# Patient Record
Sex: Female | Born: 1975 | Race: Black or African American | Hispanic: No | Marital: Single | Smoking: Never smoker
Health system: Southern US, Community
[De-identification: ages and names within clinical notes are randomized; demographics above are authoritative.]

## PROBLEM LIST (undated history)

## (undated) DIAGNOSIS — J45909 Unspecified asthma, uncomplicated: Secondary | ICD-10-CM

## (undated) HISTORY — PX: ANTERIOR CRUCIATE LIGAMENT REPAIR: SHX115

---

## 2016-08-10 ENCOUNTER — Encounter (HOSPITAL_BASED_OUTPATIENT_CLINIC_OR_DEPARTMENT_OTHER): Payer: Self-pay

## 2016-08-10 ENCOUNTER — Emergency Department (HOSPITAL_BASED_OUTPATIENT_CLINIC_OR_DEPARTMENT_OTHER)
Admission: EM | Admit: 2016-08-10 | Discharge: 2016-08-11 | Disposition: A | Discharge status: Home / Self Care | Payer: Self-pay | Attending: Emergency Medicine | Admitting: Emergency Medicine

## 2016-08-10 DIAGNOSIS — J45909 Unspecified asthma, uncomplicated: Secondary | ICD-10-CM | POA: Insufficient documentation

## 2016-08-10 DIAGNOSIS — Z760 Encounter for issue of repeat prescription: Secondary | ICD-10-CM

## 2016-08-10 DIAGNOSIS — R55 Syncope and collapse: Secondary | ICD-10-CM | POA: Insufficient documentation

## 2016-08-10 DIAGNOSIS — Z79899 Other long term (current) drug therapy: Secondary | ICD-10-CM | POA: Insufficient documentation

## 2016-08-10 DIAGNOSIS — T50905A Adverse effect of unspecified drugs, medicaments and biological substances, initial encounter: Secondary | ICD-10-CM | POA: Insufficient documentation

## 2016-08-10 HISTORY — DX: Unspecified asthma, uncomplicated: J45.909

## 2016-08-10 LAB — CBG MONITORING, ED: GLUCOSE-CAPILLARY: 104 mg/dL — AB (ref 65–99)

## 2016-08-10 NOTE — ED Triage Notes (Signed)
Per EMS patient c/o hearing loss, dizziness, nausea, and diaphoresis tonight. Pt states she was sitting at a table when this occurred. Pt reports one beer tonight. Pt states she has not eaten since Thursday, has not slept since Sunday, and has had excessive caffeine use today (4-5 large coffees) due to being excited for an upcoming event.

## 2016-08-10 NOTE — ED Provider Notes (Signed)
[facility]   CSN: [id number] Arrival date & time: 08/10/16  2339  By signing my name below, I, Rosario AdieWilliam Andrew Hiatt, attest that this documentation has been prepared under the direction and in the presence of Meila Berke, MD. Electronically Signed: Rosario AdieWilliam Andrew Hiatt, ED Scribe. 08/11/16. 12:13 AM.  History   Chief Complaint Chief Complaint  Patient presents with  . Dizziness    The history is provided by the patient. No language interpreter was used.  Dizziness  Quality:  Lightheadedness Onset quality:  Sudden Timing:  Constant Progression:  Resolved Chronicity:  New Context: medication   Context: not with loss of consciousness   Context comment:  Large caffeine intake, alcohol and mucinex with new singulair rx and no sleep  Relieved by:  Nothing Worsened by:  Nothing Ineffective treatments:  None tried Associated symptoms: no blood in stool, no chest pain, no diarrhea, no headaches, no hearing loss, no nausea, no palpitations, no shortness of breath, no syncope, no tinnitus, no vision changes, no vomiting and no weakness   Risk factors: new medications    HPI Comments: Paula Duke is a 41 y.o. female with a PMHx of asthma and seasonal allergies who presents to the Emergency Department complaining of intermittent episodes of dizziness onset this evening. She describes her dizziness as a lightheaded sensation. Pt reports that she was at home tonight sitting down when she had an acute onset of dizziness without any known precipitating factors. She reports associated diaphoresis, and nausea secondary to the onset of her dizziness. This resolves shortly after onset; however, 15 minutes following resolution, her dizziness returned. Per pt, she recently started taking Singulair 5 days ago and Mucinex this afternoon. She notes she also takes prednisone as needed for her asthma symptoms. She denies hx of smoking. She denies vomiting and any other complaints  or symptoms at this time.  No leg pain nor swelling no CP nor SOB, perc negative wells 0.    Past Medical History:  Diagnosis Date  . Asthma     There are no active problems to display for this patient.   Past Surgical History:  Procedure Laterality Date  . ANTERIOR CRUCIATE LIGAMENT REPAIR      OB History    No data available       Home Medications    Prior to Admission medications   Medication Sig Start Date End Date Taking? Authorizing Provider  ALBUTEROL IN Inhale into the lungs.   Yes [provider]  Montelukast Sodium (SINGULAIR PO) Take by mouth.   Yes [provider]   Family History History reviewed. No pertinent family history.  Social History Social History  Substance Use Topics  . Smoking status: Never Smoker  . Smokeless tobacco: Never Used  . Alcohol use Yes   Allergies   Aspirin   Review of Systems Review of Systems  Constitutional: Negative for appetite change, chills and fever.  HENT: Negative for drooling, facial swelling, hearing loss and tinnitus.   Eyes: Negative for photophobia.  Respiratory: Negative for shortness of breath.   Cardiovascular: Negative for chest pain, palpitations, leg swelling and syncope.  Gastrointestinal: Negative for anal bleeding, blood in stool, diarrhea, nausea and vomiting.  Genitourinary: Negative for difficulty urinating.  Musculoskeletal: Negative for neck stiffness.  Skin: Negative for pallor.  Neurological: Positive for light-headedness. Negative for tremors, seizures, syncope, facial asymmetry, speech difficulty, weakness, numbness and headaches.  Psychiatric/Behavioral: Negative for suicidal ideas.  All other systems reviewed and are  negative.  Physical Exam Updated Vital Signs BP 114/74 (BP Location: Right Arm)   Pulse 96   Temp 98.2 F (36.8 C) (Oral)   Resp 20   Ht 5\' 5"  (1.651 m)   Wt 255 lb (115.7 kg)   LMP 08/03/2016   SpO2 96%   BMI 42.43 kg/m   Physical Exam    Constitutional: She is oriented to person, place, and time. She appears well-developed and well-nourished.  HENT:  Head: Normocephalic.  Mouth/Throat: Oropharynx is clear and moist. No oropharyngeal exudate.  Eyes: Conjunctivae and EOM are normal. Pupils are equal, round, and reactive to light. Right eye exhibits no discharge. Left eye exhibits no discharge. No scleral icterus.  Neck: Normal range of motion. Neck supple. No JVD present. No tracheal deviation present.  Trachea is midline. No stridor or carotid bruits.   Cardiovascular: Normal rate, regular rhythm, normal heart sounds and intact distal pulses.   No murmur heard. Pulmonary/Chest: Effort normal and breath sounds normal. No stridor. No respiratory distress. She has no wheezes. She has no rales.  Lungs CTA bilaterally.  Abdominal: Soft. Bowel sounds are normal. She exhibits no distension. There is no tenderness. There is no rebound and no guarding.  Musculoskeletal: Normal range of motion. She exhibits no edema, tenderness or deformity.  All compartments are soft. No palpable cords.   Lymphadenopathy:    She has no cervical adenopathy.  Neurological: She is alert and oriented to person, place, and time. She has normal reflexes. She displays normal reflexes. No cranial nerve deficit. She exhibits normal muscle tone.  Skin: Skin is warm and dry. Capillary refill takes less than 2 seconds.  Psychiatric: She has a normal mood and affect. Her behavior is normal.  Nursing note and vitals reviewed.    ED Treatments / Results   Vitals:   08/10/16 2347 08/11/16 0155  BP: 114/74 136/76  Pulse: 96 95  Resp: 20 18  Temp: 98.2 F (36.8 C)     DIAGNOSTIC STUDIES: Oxygen Saturation is 96% on RA, normal by my interpretation.   COORDINATION OF CARE: 12:00 AM-Discussed next steps with pt. Pt verbalized understanding and is agreeable with the plan.   Labs (all labs ordered are listed, but only abnormal results are  displayed)  Results for orders placed or performed during the hospital encounter of 08/10/16  CBC with Differential/Platelet  Result Value Ref Range   WBC 7.7 4.0 - 10.5 K/uL   RBC 4.61 3.87 - 5.11 MIL/uL   Hemoglobin 13.0 12.0 - 15.0 g/dL   HCT 16.1 09.6 - 04.5 %   MCV 88.1 78.0 - 100.0 fL   MCH 28.2 26.0 - 34.0 pg   MCHC 32.0 30.0 - 36.0 g/dL   RDW 40.9 81.1 - 91.4 %   Platelets 232 150 - 400 K/uL   Neutrophils Relative % 52 %   Neutro Abs 4.0 1.7 - 7.7 K/uL   Lymphocytes Relative 21 %   Lymphs Abs 1.6 0.7 - 4.0 K/uL   Monocytes Relative 18 %   Monocytes Absolute 1.4 (H) 0.1 - 1.0 K/uL   Eosinophils Relative 9 %   Eosinophils Absolute 0.7 0.0 - 0.7 K/uL   Basophils Relative 0 %   Basophils Absolute 0.0 0.0 - 0.1 K/uL  Comprehensive metabolic panel  Result Value Ref Range   Sodium 132 (L) 135 - 145 mmol/L   Potassium 3.6 3.5 - 5.1 mmol/L   Chloride 98 (L) 101 - 111 mmol/L   CO2 26 22 -  32 mmol/L   Glucose, Bld 99 65 - 99 mg/dL   BUN 11 6 - 20 mg/dL   Creatinine, Ser 1.61 (H) 0.44 - 1.00 mg/dL   Calcium 8.3 (L) 8.9 - 10.3 mg/dL   Total Protein 7.4 6.5 - 8.1 g/dL   Albumin 3.7 3.5 - 5.0 g/dL   AST 17 15 - 41 U/L   ALT 16 14 - 54 U/L   Alkaline Phosphatase 47 38 - 126 U/L   Total Bilirubin 0.6 0.3 - 1.2 mg/dL   GFR calc non Af Amer 51 (L) >60 mL/min   GFR calc Af Amer 59 (L) >60 mL/min   Anion gap 8 5 - 15  Troponin I  Result Value Ref Range   Troponin I <0.03 <0.03 ng/mL  Rapid urine drug screen (hospital performed)  Result Value Ref Range   Opiates NONE DETECTED NONE DETECTED   Cocaine NONE DETECTED NONE DETECTED   Benzodiazepines NONE DETECTED NONE DETECTED   Amphetamines NONE DETECTED NONE DETECTED   Tetrahydrocannabinol NONE DETECTED NONE DETECTED   Barbiturates NONE DETECTED NONE DETECTED  Pregnancy, urine  Result Value Ref Range   Preg Test, Ur NEGATIVE NEGATIVE  CBG monitoring, ED  Result Value Ref Range   Glucose-Capillary 104 (H) 65 - 99 mg/dL   Dg  Chest 2 View  Result Date: 08/11/2016 CLINICAL DATA:  Acute onset of lightheadedness and near syncope. EXAM: CHEST  2 VIEW COMPARISON:  None. FINDINGS: The cardiomediastinal contours are normal. The lungs are clear. Pulmonary vasculature is normal. No consolidation, pleural effusion, or pneumothorax. No acute osseous abnormalities are seen. IMPRESSION: No acute pulmonary process. Electronically Signed   By: Rubye Oaks M.D.   On: 08/11/2016 00:49    EKG Interpretation  Date/Time:  Friday Aug 10 2016 23:48:00 EDT Ventricular Rate:  94 PR Interval:    QRS Duration: 77 QT Interval:  350 QTC Calculation: 438 R Axis:   81 Text Interpretation:  Sinus rhythm Confirmed by Nicanor Alcon, Lennyn Gange ([id number]) on 08/11/2016 12:40:06 AM        Procedures Procedures   Medications Ordered in ED  Medications  sodium chloride 0.9 % bolus 500 mL (0 mLs Intravenous Stopped 08/11/16 0337)     Final Clinical Impressions(s) / ED Diagnoses  MDM  This is a 41 y.o. -year-old female presents with lightheadedness following lack of sleep and mixing, caffeine, alcohol, new RX for singulair and mucinex this evening.  The patient is nontoxic-appearing on exam and vital signs are within normal limits.  PERC negative wells 0 highly doubt PE in this low risk patient.  Mild orthostasis.  Hydrated in the ED.  No caffeine, no ETOH and ask your doctor about the new medications and get some sleep.  Return for CP, SOB leg pain or swelling passing out weakness or numbness or any concerns.    After history, exam, and medical workup I feel the patient has been appropriately medically screened and is safe for discharge home. Pertinent diagnoses were discussed with the patient. Patient was given return precautions.  I personally performed the services described in this documentation, which was scribed in my presence. The recorded information has been reviewed and is accurate.        Kenslee Achorn, MD 08/11/16 [phone]

## 2016-08-11 ENCOUNTER — Encounter (HOSPITAL_BASED_OUTPATIENT_CLINIC_OR_DEPARTMENT_OTHER): Payer: Self-pay | Admitting: Emergency Medicine

## 2016-08-11 ENCOUNTER — Emergency Department (HOSPITAL_BASED_OUTPATIENT_CLINIC_OR_DEPARTMENT_OTHER): Payer: Self-pay

## 2016-08-11 LAB — COMPREHENSIVE METABOLIC PANEL
ALT: 16 U/L (ref 14–54)
ANION GAP: 8 (ref 5–15)
AST: 17 U/L (ref 15–41)
Albumin: 3.7 g/dL (ref 3.5–5.0)
Alkaline Phosphatase: 47 U/L (ref 38–126)
BUN: 11 mg/dL (ref 6–20)
CALCIUM: 8.3 mg/dL — AB (ref 8.9–10.3)
CO2: 26 mmol/L (ref 22–32)
CREATININE: 1.31 mg/dL — AB (ref 0.44–1.00)
Chloride: 98 mmol/L — ABNORMAL LOW (ref 101–111)
GFR, EST AFRICAN AMERICAN: 59 mL/min — AB (ref >60)
GFR, EST NON AFRICAN AMERICAN: 51 mL/min — AB (ref >60)
Glucose, Bld: 99 mg/dL (ref 65–99)
Potassium: 3.6 mmol/L (ref 3.5–5.1)
SODIUM: 132 mmol/L — AB (ref 135–145)
TOTAL PROTEIN: 7.4 g/dL (ref 6.5–8.1)
Total Bilirubin: 0.6 mg/dL (ref 0.3–1.2)

## 2016-08-11 LAB — CBC WITH DIFFERENTIAL/PLATELET
Basophils Absolute: 0 10*3/uL (ref 0.0–0.1)
Basophils Relative: 0 %
EOS ABS: 0.7 10*3/uL (ref 0.0–0.7)
EOS PCT: 9 %
HCT: 40.6 % (ref 36.0–46.0)
Hemoglobin: 13 g/dL (ref 12.0–15.0)
LYMPHS ABS: 1.6 10*3/uL (ref 0.7–4.0)
Lymphocytes Relative: 21 %
MCH: 28.2 pg (ref 26.0–34.0)
MCHC: 32 g/dL (ref 30.0–36.0)
MCV: 88.1 fL (ref 78.0–100.0)
MONO ABS: 1.4 10*3/uL — AB (ref 0.1–1.0)
MONOS PCT: 18 %
Neutro Abs: 4 10*3/uL (ref 1.7–7.7)
Neutrophils Relative %: 52 %
PLATELETS: 232 10*3/uL (ref 150–400)
RBC: 4.61 MIL/uL (ref 3.87–5.11)
RDW: 14.6 % (ref 11.5–15.5)
WBC: 7.7 10*3/uL (ref 4.0–10.5)

## 2016-08-11 LAB — TROPONIN I: Troponin I: <0.03 ng/mL (ref <0.03)

## 2016-08-11 LAB — RAPID URINE DRUG SCREEN, HOSP PERFORMED
Amphetamines: NOT DETECTED
Barbiturates: NOT DETECTED
Benzodiazepines: NOT DETECTED
Cocaine: NOT DETECTED
OPIATES: NOT DETECTED
TETRAHYDROCANNABINOL: NOT DETECTED

## 2016-08-11 LAB — PREGNANCY, URINE: Preg Test, Ur: NEGATIVE

## 2016-08-11 MED ORDER — SODIUM CHLORIDE 0.9 % IV BOLUS (SEPSIS)
500.0000 mL | Freq: Once | INTRAVENOUS | Status: AC
  Administered 2016-08-11: 500 mL via INTRAVENOUS

## 2016-08-11 MED ORDER — ALBUTEROL SULFATE (2.5 MG/3ML) 0.083% IN NEBU: 2.5000 mg | INHALATION_SOLUTION | RESPIRATORY_TRACT | 0 refills | Status: AC | PRN

## 2016-08-11 NOTE — ED Notes (Signed)
Patient transported to X-ray 

## 2017-02-19 ENCOUNTER — Telehealth: Payer: Self-pay | Admitting: Family Medicine

## 2017-02-19 NOTE — Telephone Encounter (Signed)
Please send patient labs to [facility] only.

## 2017-03-04 ENCOUNTER — Ambulatory Visit: Payer: Self-pay | Admitting: Family Medicine

## 2018-04-22 IMAGING — CR DG CHEST 2V
2 series · 2 of 2 positions shown · non-contrast
Comparison: None.

CLINICAL DATA: Acute onset of lightheadedness and near syncope.

EXAM:
CHEST  2 VIEW

[w chest pa]
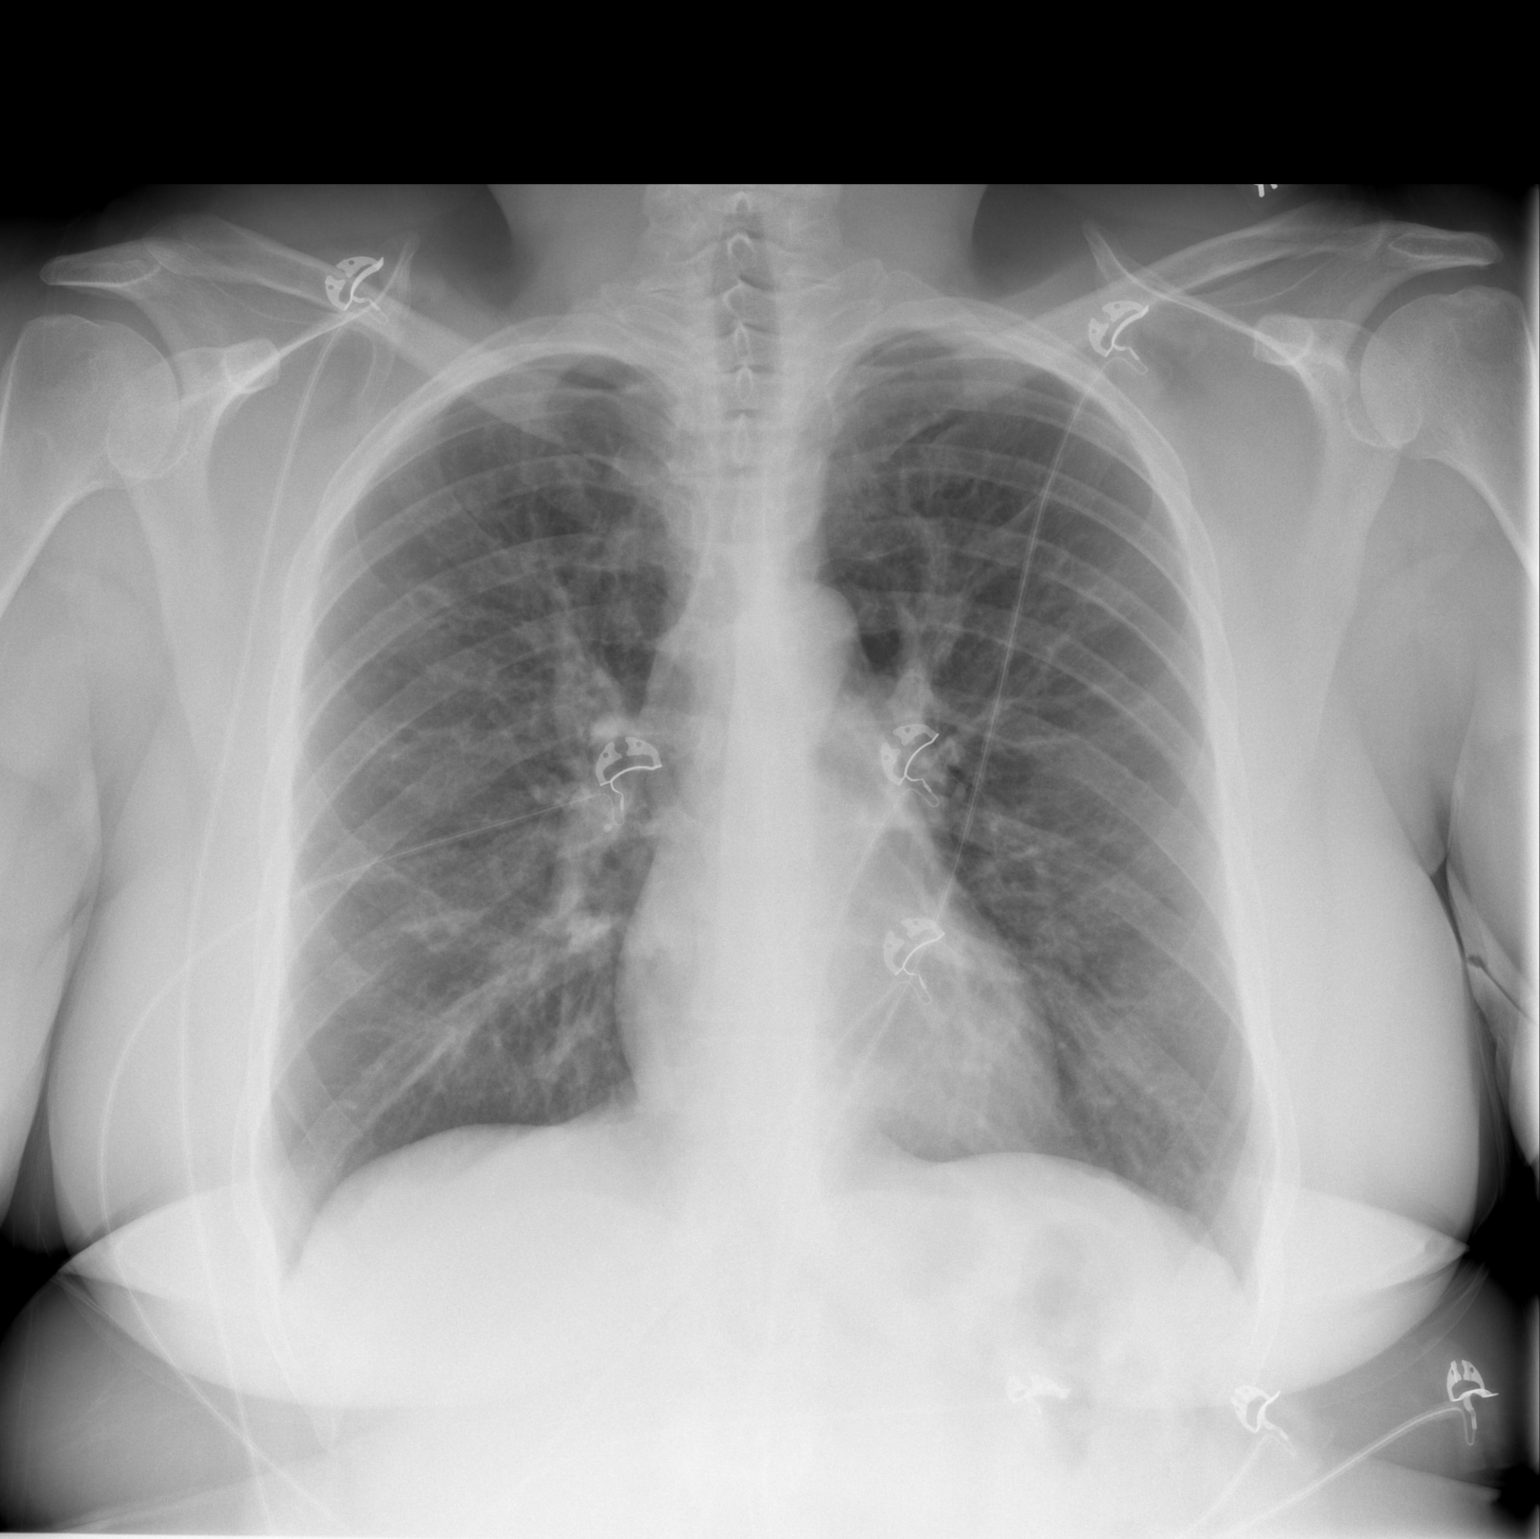

[w chest lat]
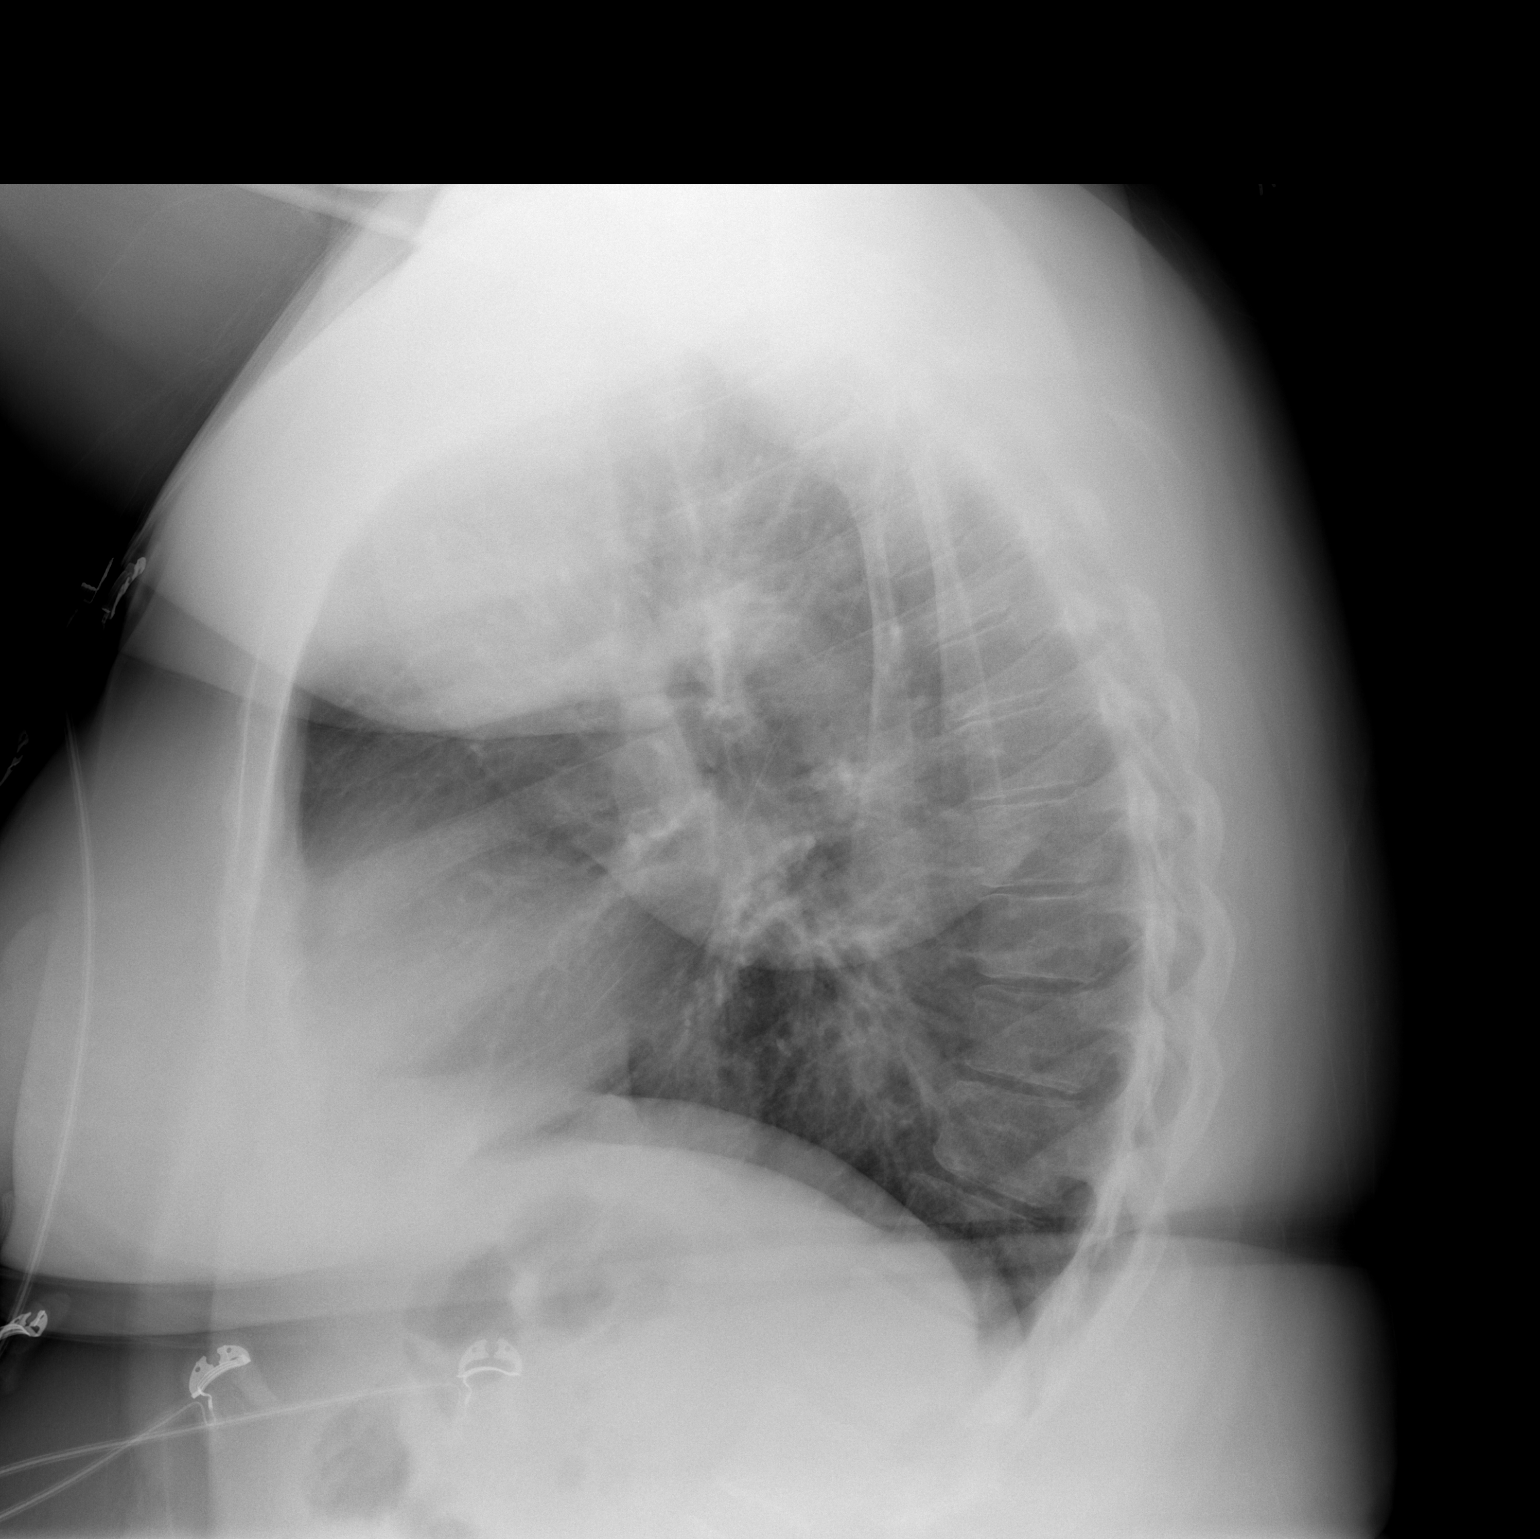

[2 of 2 positions shown; findings below may reference images not displayed]

FINDINGS: The cardiomediastinal contours are normal. The lungs are clear.
Pulmonary vasculature is normal. No consolidation, pleural effusion,
or pneumothorax. No acute osseous abnormalities are seen.
IMPRESSION: No acute pulmonary process.

## 2018-06-09 ENCOUNTER — Other Ambulatory Visit: Payer: Self-pay | Admitting: Urgent Care

## 2022-03-19 ENCOUNTER — Emergency Department (HOSPITAL_COMMUNITY): Payer: Self-pay

## 2022-03-19 ENCOUNTER — Emergency Department (HOSPITAL_COMMUNITY)
Admission: EM | Admit: 2022-03-19 | Discharge: 2022-03-19 | Disposition: A | Discharge status: Home / Self Care | Payer: Self-pay | Attending: Emergency Medicine | Admitting: Emergency Medicine

## 2022-03-19 ENCOUNTER — Other Ambulatory Visit: Payer: Self-pay

## 2022-03-19 DIAGNOSIS — R0602 Shortness of breath: Secondary | ICD-10-CM

## 2022-03-19 DIAGNOSIS — Z20822 Contact with and (suspected) exposure to covid-19: Secondary | ICD-10-CM | POA: Insufficient documentation

## 2022-03-19 DIAGNOSIS — N9489 Other specified conditions associated with female genital organs and menstrual cycle: Secondary | ICD-10-CM | POA: Insufficient documentation

## 2022-03-19 DIAGNOSIS — J4541 Moderate persistent asthma with (acute) exacerbation: Secondary | ICD-10-CM | POA: Insufficient documentation

## 2022-03-19 LAB — COMPREHENSIVE METABOLIC PANEL
ALT: 18 U/L (ref 0–44)
AST: 19 U/L (ref 15–41)
Albumin: 4 g/dL (ref 3.5–5.0)
Alkaline Phosphatase: 52 U/L (ref 38–126)
Anion gap: 6 (ref 5–15)
BUN: 8 mg/dL (ref 6–20)
CO2: 26 mmol/L (ref 22–32)
Calcium: 9 mg/dL (ref 8.9–10.3)
Chloride: 106 mmol/L (ref 98–111)
Creatinine, Ser: 1.09 mg/dL — ABNORMAL HIGH (ref 0.44–1.00)
GFR, Estimated: >60 mL/min (ref >60)
Glucose, Bld: 99 mg/dL (ref 70–99)
Potassium: 3.7 mmol/L (ref 3.5–5.1)
Sodium: 138 mmol/L (ref 135–145)
Total Bilirubin: 0.6 mg/dL (ref 0.3–1.2)
Total Protein: 7.8 g/dL (ref 6.5–8.1)

## 2022-03-19 LAB — CBC
HCT: 40 % (ref 36.0–46.0)
Hemoglobin: 12 g/dL (ref 12.0–15.0)
MCH: 28 pg (ref 26.0–34.0)
MCHC: 30 g/dL (ref 30.0–36.0)
MCV: 93.2 fL (ref 80.0–100.0)
Platelets: 271 10*3/uL (ref 150–400)
RBC: 4.29 MIL/uL (ref 3.87–5.11)
RDW: 15.3 % (ref 11.5–15.5)
WBC: 7.1 10*3/uL (ref 4.0–10.5)
nRBC: 0 % (ref 0.0–0.2)

## 2022-03-19 LAB — I-STAT BETA HCG BLOOD, ED (MC, WL, AP ONLY): I-stat hCG, quantitative: <5 m[IU]/mL (ref <5)

## 2022-03-19 LAB — D-DIMER, QUANTITATIVE: D-Dimer, Quant: 0.45 ug/mL-FEU (ref 0.00–0.50)

## 2022-03-19 LAB — RESP PANEL BY RT-PCR (RSV, FLU A&B, COVID)  RVPGX2
Influenza A by PCR: NEGATIVE
Influenza B by PCR: NEGATIVE
Resp Syncytial Virus by PCR: NEGATIVE
SARS Coronavirus 2 by RT PCR: NEGATIVE

## 2022-03-19 LAB — MAGNESIUM: Magnesium: 1.9 mg/dL (ref 1.7–2.4)

## 2022-03-19 MED ORDER — ALBUTEROL SULFATE (2.5 MG/3ML) 0.083% IN NEBU
2.5000 mg | INHALATION_SOLUTION | Freq: Once | RESPIRATORY_TRACT | Status: AC
  Administered 2022-03-19: 2.5 mg via RESPIRATORY_TRACT
  Filled 2022-03-19: qty 3

## 2022-03-19 MED ORDER — PREDNISONE 10 MG PO TABS: 40.0000 mg | ORAL_TABLET | Freq: Every day | ORAL | 0 refills | Status: AC

## 2022-03-19 MED ORDER — LACTATED RINGERS IV BOLUS
1000.0000 mL | Freq: Once | INTRAVENOUS | Status: AC
  Administered 2022-03-19: 1000 mL via INTRAVENOUS

## 2022-03-19 MED ORDER — ALBUTEROL (5 MG/ML) CONTINUOUS INHALATION SOLN: 10.0000 mg/h | INHALATION_SOLUTION | Freq: Once | RESPIRATORY_TRACT | Status: DC

## 2022-03-19 NOTE — ED Provider Notes (Signed)
[facility]-EMERGENCY DEPT Provider Note   CSN: [id number] Arrival date & time: 03/19/22  1805     History  Chief Complaint  Patient presents with   Shortness of Breath    Paula Duke is a 46 y.o. female.   Shortness of Breath Patient presents for shortness of breath.  Medical history includes asthma.  She has had some mild worsening shortness of breath over the past 3 days.  She takes Symbicort daily.  She has albuterol inhalers and nebulizers at home to take as needed.  Today at work, she had acute worsening of shortness of breath after lunch.  She did have her nebulizer with her and did 2 albuterol treatments.  This did improve her symptoms.  Although improved, she continued to have increased work of breathing and coworkers became concerned.  EMS was called.  Patient took 50 mg of Benadryl for treatment of possible allergen that was contained in her lunchtime solid.  With EMS, she received 125 mg of Solu-Medrol, DuoNeb, albuterol, and Zofran.  She reports improved symptoms with mild continued chest tightness and shortness of breath.  She denies any other symptoms.      Home Medications Prior to Admission medications   Medication Sig Start Date End Date Taking? Authorizing Provider  predniSONE (DELTASONE) 10 MG tablet Take 4 tablets (40 mg total) by mouth daily for 4 days. 03/19/22 03/23/22 Yes Gloris Manchester, MD  albuterol (PROVENTIL) (2.5 MG/3ML) 0.083% nebulizer solution Take 3 mLs (2.5 mg total) by nebulization every 4 (four) hours as needed for wheezing or shortness of breath. 08/11/16   Palumbo, April, MD  ALBUTEROL IN Inhale into the lungs.    [provider]  Montelukast Sodium (SINGULAIR PO) Take by mouth.    [provider]      Allergies    Aspirin    Review of Systems   Review of Systems  Respiratory:  Positive for chest tightness and shortness of breath.   All other systems reviewed and are negative.   Physical Exam Updated  Vital Signs BP (!) 146/90   Pulse (!) 104   Resp 17   LMP 03/19/2022   SpO2 95%  Physical Exam Vitals and nursing note reviewed.  Constitutional:      General: She is not in acute distress.    Appearance: She is well-developed. She is not ill-appearing, toxic-appearing or diaphoretic.  HENT:     Head: Normocephalic and atraumatic.     Mouth/Throat:     Mouth: Mucous membranes are moist.  Eyes:     Conjunctiva/sclera: Conjunctivae normal.  Neck:     Vascular: No JVD.  Cardiovascular:     Rate and Rhythm: Regular rhythm. Tachycardia present.     Heart sounds: No murmur heard. Pulmonary:     Effort: Pulmonary effort is normal. No tachypnea or respiratory distress.     Breath sounds: Wheezing present. No rhonchi or rales.  Abdominal:     Palpations: Abdomen is soft.     Tenderness: There is no abdominal tenderness.  Musculoskeletal:        General: No swelling. Normal range of motion.     Cervical back: Normal range of motion and neck supple.     Right lower leg: No edema.     Left lower leg: No edema.  Skin:    General: Skin is warm and dry.     Capillary Refill: Capillary refill takes less than 2 seconds.     Coloration: Skin is not  cyanotic or pale.  Neurological:     General: No focal deficit present.     Mental Status: She is alert and oriented to person, place, and time.  Psychiatric:        Mood and Affect: Mood normal.        Behavior: Behavior normal.     ED Results / Procedures / Treatments   Labs (all labs ordered are listed, but only abnormal results are displayed) Labs Reviewed  COMPREHENSIVE METABOLIC PANEL - Abnormal; Notable for the following components:      Result Value   Creatinine, Ser 1.09 (*)    All other components within normal limits  RESP PANEL BY RT-PCR (RSV, FLU A&B, COVID)  RVPGX2  CBC  MAGNESIUM  D-DIMER, QUANTITATIVE  I-STAT BETA HCG BLOOD, ED ([facility], [facility], [facility] ONLY)    EKG EKG Interpretation  Date/Time:  Monday March 19 2022  21:12:42 EST Ventricular Rate:  95 PR Interval:  132 QRS Duration: 82 QT Interval:  362 QTC Calculation: 456 R Axis:   57 Text Interpretation: Sinus rhythm Low voltage, precordial leads Confirmed by Gloris Manchester [phone]) on 03/19/2022 9:39:32 PM  Radiology DG Chest Port 1 View  Result Date: 03/19/2022 CLINICAL DATA:  Dyspnea short of breath EXAM: PORTABLE CHEST 1 VIEW COMPARISON:  08/11/2016 FINDINGS: Mild bronchitic changes. No acute airspace disease or pleural effusion. Normal cardiac size. No pneumothorax IMPRESSION: No active disease. Mild bronchitic changes. Electronically Signed   By: Jasmine Pang M.D.   On: 03/19/2022 19:20    Procedures Procedures    Medications Ordered in ED Medications  lactated ringers bolus 1,000 mL (1,000 mLs Intravenous New Bag/Given 03/19/22 1847)  albuterol (PROVENTIL) (2.5 MG/3ML) 0.083% nebulizer solution 2.5 mg (2.5 mg Nebulization Given 03/19/22 1912)    ED Course/ Medical Decision Making/ A&P                           Medical Decision Making Amount and/or Complexity of Data Reviewed Labs: ordered. Radiology: ordered.  Risk Prescription drug management.   This patient presents to the ED for concern of shortness of breath, this involves an extensive number of treatment options, and is a complaint that carries with it a high risk of complications and morbidity.  The differential diagnosis includes asthma exacerbation, allergic reaction, URI, pneumonia, PE, anemia   Co morbidities that complicate the patient evaluation  Asthma, seasonal allergies   Additional history obtained:  Additional history obtained from N/A External records from outside source obtained and reviewed including EMR   Lab Tests:  I Ordered, and personally interpreted labs.  The pertinent results include: Normal electrolytes, normal D-dimer, no leukocytosis, normal hemoglobin   Imaging Studies ordered:  I ordered imaging studies including chest x-ray I  independently visualized and interpreted imaging which showed no acute findings I agree with the radiologist interpretation   Cardiac Monitoring: / EKG:  The patient was maintained on a cardiac monitor.  I personally viewed and interpreted the cardiac monitored which showed an underlying rhythm of: Sinus rhythm   Problem List / ED Course / Critical interventions / Medication management  Patient with history of asthma, presenting for shortness of breath.  From her description, it does appear that an asthma exacerbation has been building up for the past several days.  She had an acute worsening after lunchtime and feels like she may have had an unknown allergen in her salad.  She did take 50 mg of Benadryl prior  to arrival.  EMS also gave breathing treatments and Solu-Medrol.  Although EMS reported a SpO2 of 89% on room air, patient's SpO2 on arrival in the ED is 100% on room air.  She is able to speak in complete sentences.  She is tachycardic and hypertensive, expected following albuterol.  On lung auscultation, she does continue to have diffuse expiratory wheezes.  Continue albuterol and IV fluids were ordered.  Laboratory workup is reassuring.  D-dimer is normal.  Following continuous albuterol, patient had resolution of wheezing on lung auscultation.  Shortness of breath subjectively improved.  She was monitored following continuous albuterol and did not have any recurrence of shortness of breath or wheezing.  She does feel comfortable with discharge home.  She was prescribed ongoing steroids and advised to continue breathing treatments at home as needed.  She was encouraged to return to the ED if she does experience worsening symptoms despite home therapies.  She was discharged in good condition. I ordered medication including albuterol for asthma exacerbation Reevaluation of the patient after these medicines showed that the patient resolved I have reviewed the patients home medicines and have made  adjustments as needed   Social Determinants of Health:  Has access to outpatient care         Final Clinical Impression(s) / ED Diagnoses Final diagnoses:  SOB (shortness of breath)  Moderate persistent asthma with exacerbation    Rx / DC Orders ED Discharge Orders          Ordered    predniSONE (DELTASONE) 10 MG tablet  Daily        03/19/22 2141              Gloris Manchester, MD 03/19/22 2142

## 2022-03-19 NOTE — ED Notes (Signed)
Pt verbalized understanding of discharge instructions. Pt wheeled from Ed transferred to car. Family to drive home.

## 2022-03-19 NOTE — ED Triage Notes (Signed)
EMS reports coming from work, Hx of asthma, SOB at lunch. Used an inhaler and a neb with no relief. Pt took 50mg  Benadryl at lunch.  BP 180/100 Hr 120 Rr 24 Sp02 89 RA (99 6ltrs with neb)  20ga LAC  7.5 Albuteroal 0.5 Atrovent 125mg  Solumedrol 4mg  Zofran enroute

## 2022-03-19 NOTE — Discharge Instructions (Signed)
A prescription for continued steroids was sent to your pharmacy.  Take this for the next 4 days, starting tomorrow.  Continue breathing treatments at home as needed.  Return to the emergency department for any worsening of symptoms.
